# Patient Record
Sex: Female | Born: 1995 | Race: White | Hispanic: No | Marital: Single | Smoking: Former smoker
Health system: Southern US, Community
[De-identification: ages and names within clinical notes are randomized; demographics above are authoritative.]

## PROBLEM LIST (undated history)

## (undated) DIAGNOSIS — M549 Dorsalgia, unspecified: Secondary | ICD-10-CM

## (undated) HISTORY — PX: WISDOM TOOTH EXTRACTION: SHX21

---
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
## 2015-11-27 IMAGING — CR DG CHEST 2V
2 series · 2 of 2 positions shown · non-contrast
Comparison: None.

CLINICAL DATA: Vomiting and headache for 2 days. Central chest pain
today. Initial encounter.

EXAM:
CHEST  2 VIEW

[chest pa]
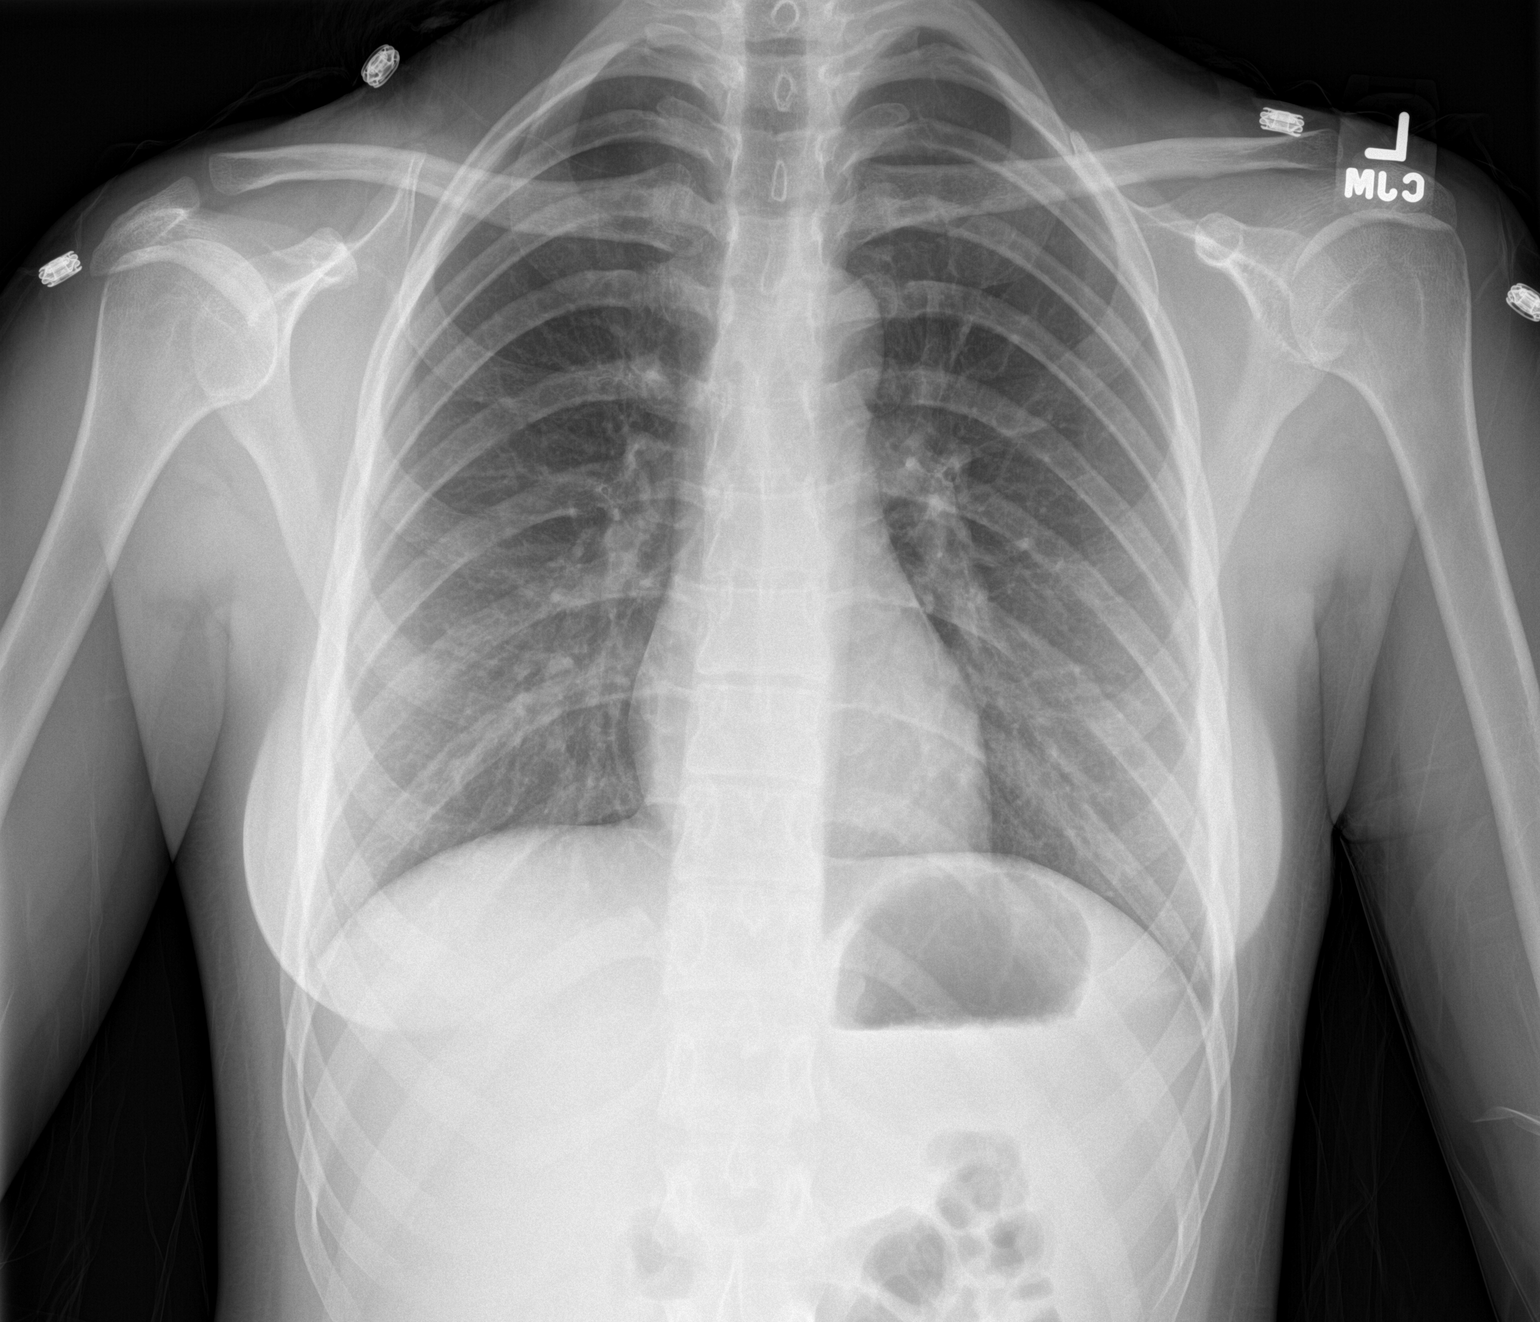

[chest lat]
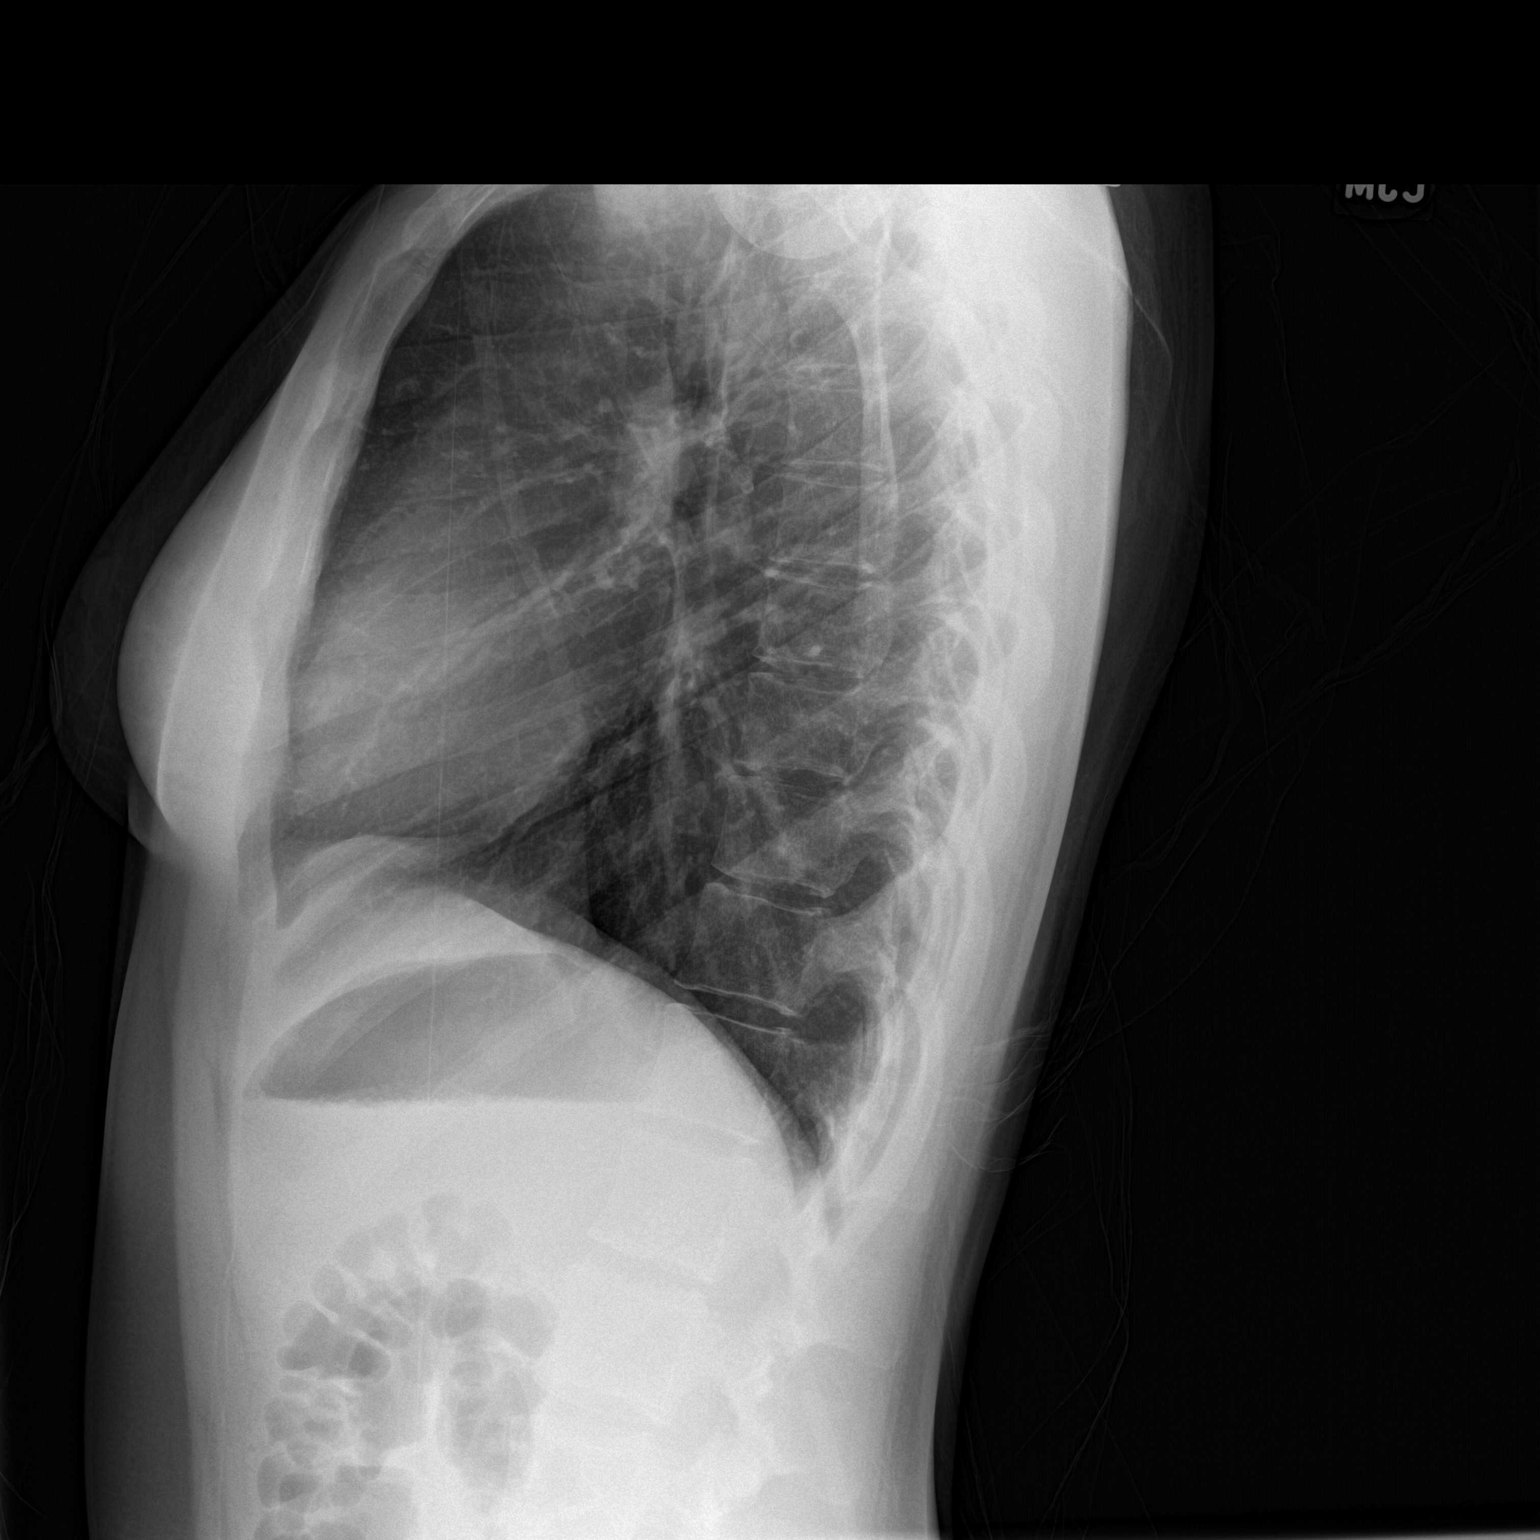

[2 of 2 positions shown; findings below may reference images not displayed]

FINDINGS: Cardiopericardial silhouette within normal limits. Mediastinal
contours normal. Trachea midline. No airspace disease or effusion.
IMPRESSION: No active cardiopulmonary disease.
